# Patient Record
Sex: Male | Born: 1995 | Race: Black or African American | Hispanic: No | Marital: Single | Smoking: Never smoker
Health system: Southern US, Community
[De-identification: ages and names within clinical notes are randomized; demographics above are authoritative.]

---
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
## 2019-07-17 ENCOUNTER — Encounter (HOSPITAL_BASED_OUTPATIENT_CLINIC_OR_DEPARTMENT_OTHER): Payer: Self-pay | Admitting: *Deleted

## 2019-07-17 ENCOUNTER — Emergency Department (HOSPITAL_BASED_OUTPATIENT_CLINIC_OR_DEPARTMENT_OTHER): Payer: Worker's Compensation

## 2019-07-17 ENCOUNTER — Other Ambulatory Visit: Payer: Self-pay

## 2019-07-17 ENCOUNTER — Emergency Department (HOSPITAL_BASED_OUTPATIENT_CLINIC_OR_DEPARTMENT_OTHER)
Admission: EM | Admit: 2019-07-17 | Discharge: 2019-07-17 | Disposition: A | Discharge status: Home / Self Care | Payer: Worker's Compensation | Attending: Emergency Medicine | Admitting: Emergency Medicine

## 2019-07-17 DIAGNOSIS — S80812A Abrasion, left lower leg, initial encounter: Secondary | ICD-10-CM | POA: Insufficient documentation

## 2019-07-17 DIAGNOSIS — Y999 Unspecified external cause status: Secondary | ICD-10-CM | POA: Insufficient documentation

## 2019-07-17 DIAGNOSIS — Y9389 Activity, other specified: Secondary | ICD-10-CM | POA: Diagnosis not present

## 2019-07-17 DIAGNOSIS — M79605 Pain in left leg: Secondary | ICD-10-CM

## 2019-07-17 DIAGNOSIS — T148XXA Other injury of unspecified body region, initial encounter: Secondary | ICD-10-CM

## 2019-07-17 DIAGNOSIS — Y92410 Unspecified street and highway as the place of occurrence of the external cause: Secondary | ICD-10-CM | POA: Diagnosis not present
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
## 2019-07-17 NOTE — Discharge Instructions (Addendum)
Use crutches as needed for comfort. While at home please rest, ice, and elevate your leg to help with the pain/inflammation You can take Tylenol and Ibuprofen as needed for pain Please follow up with Dr. Jordan Likes if your pain continues to further evaluation  Keep wound clean and dry. You may apply bacitracin ointment to it to help with wound healing Return to the ED IMMEDIATELY for any worsening symptoms including worsening pain to your leg, swelling/redness to the leg, chest pain, shortness of breath
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
## 2023-01-09 DIAGNOSIS — Z419 Encounter for procedure for purposes other than remedying health state, unspecified: Secondary | ICD-10-CM | POA: Diagnosis not present

## 2023-02-09 DIAGNOSIS — Z419 Encounter for procedure for purposes other than remedying health state, unspecified: Secondary | ICD-10-CM | POA: Diagnosis not present

## 2023-03-11 DIAGNOSIS — Z419 Encounter for procedure for purposes other than remedying health state, unspecified: Secondary | ICD-10-CM | POA: Diagnosis not present

## 2023-04-11 DIAGNOSIS — Z419 Encounter for procedure for purposes other than remedying health state, unspecified: Secondary | ICD-10-CM | POA: Diagnosis not present

## 2023-05-12 DIAGNOSIS — Z419 Encounter for procedure for purposes other than remedying health state, unspecified: Secondary | ICD-10-CM | POA: Diagnosis not present

## 2023-06-09 DIAGNOSIS — Z419 Encounter for procedure for purposes other than remedying health state, unspecified: Secondary | ICD-10-CM | POA: Diagnosis not present

## 2023-07-21 DIAGNOSIS — Z419 Encounter for procedure for purposes other than remedying health state, unspecified: Secondary | ICD-10-CM | POA: Diagnosis not present

## 2023-08-20 DIAGNOSIS — Z419 Encounter for procedure for purposes other than remedying health state, unspecified: Secondary | ICD-10-CM | POA: Diagnosis not present

## 2023-09-20 DIAGNOSIS — Z419 Encounter for procedure for purposes other than remedying health state, unspecified: Secondary | ICD-10-CM | POA: Diagnosis not present

## 2023-10-20 DIAGNOSIS — Z419 Encounter for procedure for purposes other than remedying health state, unspecified: Secondary | ICD-10-CM | POA: Diagnosis not present
